# Patient Record
Sex: Male | Born: 1995
Health system: Southern US, Community
[De-identification: ages and names within clinical notes are randomized; demographics above are authoritative.]

---
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
## 2015-02-11 ENCOUNTER — Encounter (HOSPITAL_COMMUNITY): Payer: Self-pay | Admitting: Emergency Medicine

## 2015-02-11 ENCOUNTER — Emergency Department (HOSPITAL_COMMUNITY)
Admission: EM | Admit: 2015-02-11 | Discharge: 2015-02-11 | Disposition: A | Discharge status: Home / Self Care | Payer: Medicaid Other | Attending: Emergency Medicine | Admitting: Emergency Medicine

## 2015-02-11 DIAGNOSIS — Z792 Long term (current) use of antibiotics: Secondary | ICD-10-CM | POA: Diagnosis not present

## 2015-02-11 DIAGNOSIS — J069 Acute upper respiratory infection, unspecified: Secondary | ICD-10-CM | POA: Diagnosis not present

## 2015-02-11 DIAGNOSIS — Z7952 Long term (current) use of systemic steroids: Secondary | ICD-10-CM | POA: Diagnosis not present

## 2015-02-11 DIAGNOSIS — H9202 Otalgia, left ear: Secondary | ICD-10-CM | POA: Diagnosis present

## 2015-02-11 DIAGNOSIS — Z720 Tobacco use: Secondary | ICD-10-CM | POA: Insufficient documentation

## 2015-02-11 DIAGNOSIS — H6092 Unspecified otitis externa, left ear: Secondary | ICD-10-CM | POA: Diagnosis not present

## 2015-02-11 LAB — RAPID STREP SCREEN (MED CTR MEBANE ONLY): Streptococcus, Group A Screen (Direct): NEGATIVE

## 2015-02-11 MED ORDER — BENZONATATE 100 MG PO CAPS
200.0000 mg | ORAL_CAPSULE | Freq: Once | ORAL | Status: AC
  Administered 2015-02-11: 200 mg via ORAL
  Filled 2015-02-11: qty 2

## 2015-02-11 MED ORDER — NEOMYCIN-POLYMYXIN-HC 3.5-10000-1 OT SUSP: 4.0000 [drp] | Freq: Three times a day (TID) | OTIC | Status: AC

## 2015-02-11 MED ORDER — BENZONATATE 100 MG PO CAPS: 200.0000 mg | ORAL_CAPSULE | Freq: Three times a day (TID) | ORAL | Status: DC | PRN

## 2015-02-11 MED ORDER — IBUPROFEN 800 MG PO TABS
800.0000 mg | ORAL_TABLET | Freq: Once | ORAL | Status: AC
  Administered 2015-02-11: 800 mg via ORAL
  Filled 2015-02-11: qty 1

## 2015-02-11 MED ORDER — IBUPROFEN 600 MG PO TABS: 600.0000 mg | ORAL_TABLET | Freq: Four times a day (QID) | ORAL | Status: DC | PRN

## 2015-02-11 NOTE — ED Provider Notes (Signed)
CSN: [id number]     Arrival date & time 02/11/15  0815 History   First MD Initiated Contact with Patient 02/11/15 [phone]     Chief Complaint  Patient presents with  . Otalgia     (Consider location/radiation/quality/duration/timing/severity/associated sxs/prior Treatment) The history is provided by the patient.   Jeffery Brooks is a 19 y.o. male presenting with a 2 day history of uri type symptoms which started with sore throat, cough, nasal congestion described as thick and bright green without blood clots or streaks of blood, aching mid forehead pain and now woke today with pain in his left ear.  He denies drainage from the ear and has had no hearing loss or tinnitus.  He denies fevers, but does endorse post nasal drip.  He was exposed to a coworker early in the week with similar symptoms.  His cough has been productive of mucus similar to the post nasal drip.  He denies sob, chest pain,nausea or vomiting.  He endorses decreased appetite over the last day.  He has taken no medicines for his symptoms prior to arrival.   History reviewed. No pertinent past medical history. History reviewed. No pertinent past surgical history. History reviewed. No pertinent family history. History  Substance Use Topics  . Smoking status: Current Every Day Smoker -- 0.50 packs/day    Types: Cigarettes  . Smokeless tobacco: Never Used  . Alcohol Use: No    Review of Systems  Constitutional: Negative for fever and chills.  HENT: Positive for congestion, ear pain, postnasal drip, rhinorrhea and sore throat. Negative for facial swelling, sinus pressure, trouble swallowing and voice change.   Eyes: Negative for discharge.  Respiratory: Positive for cough. Negative for shortness of breath, wheezing and stridor.   Cardiovascular: Negative for chest pain.  Gastrointestinal: Negative for nausea, vomiting and abdominal pain.  Genitourinary: Negative.       Allergies  Review of patient's allergies  indicates no known allergies.  Home Medications   Prior to Admission medications   Medication Sig Start Date End Date Taking? Authorizing Provider  alum & mag hydroxide-simeth (MAALOX/MYLANTA) 200-200-20 MG/5ML suspension Take by mouth every 6 (six) hours as needed for indigestion or heartburn.    Historical Provider, MD  azithromycin (ZITHROMAX Z-PAK) 250 MG tablet Take 2 tablets (500 mg) on  Day 1,  followed by 1 tablet (250 mg) once daily on Days 2 through 5. Patient not taking: Reported on 10/12/2014 01/05/13   Babs Sciara, MD  benzonatate (TESSALON) 100 MG capsule Take 2 capsules (200 mg total) by mouth 3 (three) times daily as needed. 02/11/15   Burgess Amor, PA-C  hyoscyamine (LEVSIN/SL) 0.125 MG SL tablet Place 1 tablet (0.125 mg total) under the tongue every 6 (six) hours as needed for cramping. Patient not taking: Reported on 10/12/2014 03/15/13   Babs Sciara, MD  ibuprofen (ADVIL,MOTRIN) 600 MG tablet Take 1 tablet (600 mg total) by mouth every 6 (six) hours as needed. 02/11/15   Burgess Amor, PA-C  neomycin-polymyxin-hydrocortisone (CORTISPORIN) 3.5-10000-1 otic suspension Place 4 drops into the left ear 3 (three) times daily. 02/11/15 02/18/15  Burgess Amor, PA-C  ondansetron (ZOFRAN) 4 MG tablet Take 1 tablet (4 mg total) by mouth every 6 (six) hours. Patient not taking: Reported on 02/11/2015 10/12/14   Raeford Razor, MD  triamcinolone cream (KENALOG) 0.1 % Apply 1 application topically 2 (two) times daily. Prn rash; use up to 2 weeks Patient not taking: Reported on 10/12/2014 12/08/13   Presley Raddle  Hoskins, NP   BP 139/110 mmHg  Pulse 100  Temp(Src) 98.1 F (36.7 C) (Oral)  Resp 18  Ht 5\' 7"  (1.702 m)  Wt 185 lb (83.915 kg)  BMI 28.97 kg/m2  SpO2 99% Physical Exam  Constitutional: He is oriented to person, place, and time. He appears well-developed and well-nourished.  HENT:  Head: Normocephalic and atraumatic.  Right Ear: Tympanic membrane and ear canal normal.  Left Ear: Tympanic  membrane normal. No swelling. Tympanic membrane is not injected.  Nose: Mucosal edema present. No rhinorrhea. Right sinus exhibits no maxillary sinus tenderness and no frontal sinus tenderness. Left sinus exhibits no maxillary sinus tenderness and no frontal sinus tenderness.  Mouth/Throat: Uvula is midline and mucous membranes are normal. Posterior oropharyngeal erythema present. No oropharyngeal exudate, posterior oropharyngeal edema or tonsillar abscesses.  Erythema mid left ear canal, no appreciable edema, no drainage.  Eyes: Conjunctivae are normal.  Cardiovascular: Normal rate and normal heart sounds.   Pulmonary/Chest: Effort normal. No respiratory distress. He has no wheezes. He has no rales.  Abdominal: Soft. There is no tenderness.  Musculoskeletal: Normal range of motion.  Neurological: He is alert and oriented to person, place, and time.  Skin: Skin is warm and dry. No rash noted.  Psychiatric: He has a normal mood and affect.    ED Course  Procedures (including critical care time) Labs Review Labs Reviewed  RAPID STREP SCREEN (NOT AT [facility])  CULTURE, GROUP A STREP    Imaging Review No results found.   EKG Interpretation None      MDM   Final diagnoses:  Acute URI  Otitis externa, left    Patients labs and/or radiological studies were reviewed and considered during the medical decision making and disposition process.  Results were also discussed with patient. Strep negative.  Suspect viral uri, although left ear canal has appearance of possible early external otitis.  Prescribed ibuprofen, tessalon for cough, cortisporin otic for left external otitis.  Plan f/u with pcp if not improving.  Also advised recheck of bp once feeling better as it is elevated today.    Burgess Amor, PA-C 02/11/15 [id number]  Layla Maw Ward, DO 02/11/15 [id number]

## 2015-02-11 NOTE — ED Notes (Signed)
Patient c/o left earache, sore throat, and headache that woke him this morning. Denies any fevers or drainage from ear.

## 2015-02-11 NOTE — Discharge Instructions (Signed)
Otitis Externa Otitis externa is a bacterial or fungal infection of the outer ear canal. This is the area from the eardrum to the outside of the ear. Otitis externa is sometimes called "swimmer's ear." CAUSES  Possible causes of infection include:  Swimming in dirty water.  Moisture remaining in the ear after swimming or bathing.  Mild injury (trauma) to the ear.  Objects stuck in the ear (foreign body).  Cuts or scrapes (abrasions) on the outside of the ear. SIGNS AND SYMPTOMS  The first symptom of infection is often itching in the ear canal. Later signs and symptoms may include swelling and redness of the ear canal, ear pain, and yellowish-white fluid (pus) coming from the ear. The ear pain may be worse when pulling on the earlobe. DIAGNOSIS  Your health care provider will perform a physical exam. A sample of fluid may be taken from the ear and examined for bacteria or fungi. TREATMENT  Antibiotic ear drops are often given for 10 to 14 days. Treatment may also include pain medicine or corticosteroids to reduce itching and swelling. HOME CARE INSTRUCTIONS   Apply antibiotic ear drops to the ear canal as prescribed by your health care provider.  Take medicines only as directed by your health care provider.  If you have diabetes, follow any additional treatment instructions from your health care provider.  Keep all follow-up visits as directed by your health care provider. PREVENTION   Keep your ear dry. Use the corner of a towel to absorb water out of the ear canal after swimming or bathing.  Avoid scratching or putting objects inside your ear. This can damage the ear canal or remove the protective wax that lines the canal. This makes it easier for bacteria and fungi to grow.  Avoid swimming in lakes, polluted water, or poorly chlorinated pools.  You may use ear drops made of rubbing alcohol and vinegar after swimming. Combine equal parts of white vinegar and alcohol in a bottle.  Put 3 or 4 drops into each ear after swimming. SEEK MEDICAL CARE IF:   You have a fever.  Your ear is still red, swollen, painful, or draining pus after 3 days.  Your redness, swelling, or pain gets worse.  You have a severe headache.  You have redness, swelling, pain, or tenderness in the area behind your ear. MAKE SURE YOU:   Understand these instructions.  Will watch your condition.  Will get help right away if you are not doing well or get worse. Document Released: 08/04/2005 Document Revised: 12/19/2013 Document Reviewed: 08/21/2011 [facility] Patient Information 2015 [place], [place]. This information is not intended to replace advice given to you by your health care provider. Make sure you discuss any questions you have with your health care provider.  Upper Respiratory Infection, Adult An upper respiratory infection (URI) is also known as the common cold. It is often caused by a type of germ (virus). Colds are easily spread (contagious). You can pass it to others by kissing, coughing, sneezing, or drinking out of the same glass. Usually, you get better in 1 or 2 weeks.  HOME CARE   Only take medicine as told by your doctor.  Use a warm mist humidifier or breathe in steam from a hot shower.  Drink enough water and fluids to keep your pee (urine) clear or pale yellow.  Get plenty of rest.  Return to work when your temperature is back to normal or as told by your doctor. You may use a  face mask and wash your hands to stop your cold from spreading. GET HELP RIGHT AWAY IF:   After the first few days, you feel you are getting worse.  You have questions about your medicine.  You have chills, shortness of breath, or brown or red spit (mucus).  You have yellow or brown snot (nasal discharge) or pain in the face, especially when you bend forward.  You have a fever, puffy (swollen) neck, pain when you swallow, or white spots in the back of your throat.  You have a bad  headache, ear pain, sinus pain, or chest pain.  You have a high-pitched whistling sound when you breathe in and out (wheezing).  You have a lasting cough or cough up blood.  You have sore muscles or a stiff neck. MAKE SURE YOU:   Understand these instructions.  Will watch your condition.  Will get help right away if you are not doing well or get worse. Document Released: 01/21/2008 Document Revised: 10/27/2011 Document Reviewed: 11/09/2013 [facility] Patient Information 2015 [place], [place]. This information is not intended to replace advice given to you by your health care provider. Make sure you discuss any questions you have with your health care provider.

## 2015-02-13 LAB — CULTURE, GROUP A STREP

## 2015-05-28 ENCOUNTER — Encounter: Payer: Self-pay | Admitting: Family Medicine

## 2015-05-28 ENCOUNTER — Ambulatory Visit (INDEPENDENT_AMBULATORY_CARE_PROVIDER_SITE_OTHER): Payer: Medicaid Other | Admitting: Family Medicine

## 2015-05-28 VITALS — BP 118/82 | Ht 66.75 in | Wt 195.0 lb

## 2015-05-28 DIAGNOSIS — Z0000 Encounter for general adult medical examination without abnormal findings: Secondary | ICD-10-CM

## 2015-05-28 NOTE — Progress Notes (Signed)
   Subjective:    Patient ID: Jeffery Brooks, male    DOB: Jan 11, 1996, 19 y.o.   MRN: [id number]  HPI The patient comes in today for a wellness visit.    A review of their health history was completed.  A review of medications was also completed.  Any needed refills; not taking any meds.   Eating habits: not health conscious  Falls/  MVA accidents in past few months: no falls or accidents  Regular exercise: physical job. Some exercise  Specialist pt sees on regular basis: none  Preventative health issues were discussed.   Additional concerns: none     Review of Systems  Constitutional: Negative for fever, activity change and appetite change.  HENT: Negative for congestion and rhinorrhea.   Eyes: Negative for discharge.  Respiratory: Negative for cough and wheezing.   Cardiovascular: Negative for chest pain.  Gastrointestinal: Negative for vomiting, abdominal pain and blood in stool.  Genitourinary: Negative for frequency and difficulty urinating.  Musculoskeletal: Negative for neck pain.  Skin: Negative for rash.  Allergic/Immunologic: Negative for environmental allergies and food allergies.  Neurological: Negative for weakness and headaches.  Psychiatric/Behavioral: Negative for agitation.       Objective:   Physical Exam  Constitutional: He appears well-developed and well-nourished.  HENT:  Head: Normocephalic and atraumatic.  Right Ear: External ear normal.  Left Ear: External ear normal.  Nose: Nose normal.  Mouth/Throat: Oropharynx is clear and moist.  Eyes: EOM are normal. Pupils are equal, round, and reactive to light.  Neck: Normal range of motion. Neck supple. No thyromegaly present.  Cardiovascular: Normal rate, regular rhythm and normal heart sounds.   No murmur heard. Pulmonary/Chest: Effort normal and breath sounds normal. No respiratory distress. He has no wheezes.  Abdominal: Soft. Bowel sounds are normal. He exhibits no distension and no  mass. There is no tenderness.  Genitourinary: Penis normal.  Musculoskeletal: Normal range of motion. He exhibits no edema.  Lymphadenopathy:    He has no cervical adenopathy.  Neurological: He is alert. He exhibits normal muscle tone.  Skin: Skin is warm and dry. No erythema.  Psychiatric: He has a normal mood and affect. His behavior is normal. Judgment normal.          Assessment & Plan:  Wellness Safety dietary discussed Immunizations recommended he will go to the health department to get these Regular physical activity watching diet discussed Patient encouraged quit smoking.

## 2015-05-28 NOTE — Patient Instructions (Signed)
Smoking Cessation, Tips for Success If you are ready to quit smoking, congratulations! You have chosen to help yourself be healthier. Cigarettes bring nicotine, tar, carbon monoxide, and other irritants into your body. Your lungs, heart, and blood vessels will be able to work better without these poisons. There are many different ways to quit smoking. Nicotine gum, nicotine patches, a nicotine inhaler, or nicotine nasal spray can help with physical craving. Hypnosis, support groups, and medicines help break the habit of smoking. WHAT THINGS CAN I DO TO MAKE QUITTING EASIER?  Here are some tips to help you quit for good:  Pick a date when you will quit smoking completely. Tell all of your friends and family about your plan to quit on that date.  Do not try to slowly cut down on the number of cigarettes you are smoking. Pick a quit date and quit smoking completely starting on that day.  Throw away all cigarettes.   Clean and remove all ashtrays from your home, work, and car.  On a card, write down your reasons for quitting. Carry the card with you and read it when you get the urge to smoke.  Cleanse your body of nicotine. Drink enough water and fluids to keep your urine clear or pale yellow. Do this after quitting to flush the nicotine from your body.  Learn to predict your moods. Do not let a bad situation be your excuse to have a cigarette. Some situations in your life might tempt you into wanting a cigarette.  Never have "just one" cigarette. It leads to wanting another and another. Remind yourself of your decision to quit.  Change habits associated with smoking. If you smoked while driving or when feeling stressed, try other activities to replace smoking. Stand up when drinking your coffee. Brush your teeth after eating. Sit in a different chair when you read the paper. Avoid alcohol while trying to quit, and try to drink fewer caffeinated beverages. Alcohol and caffeine may urge you to  smoke.  Avoid foods and drinks that can trigger a desire to smoke, such as sugary or spicy foods and alcohol.  Ask people who smoke not to smoke around you.  Have something planned to do right after eating or having a cup of coffee. For example, plan to take a walk or exercise.  Try a relaxation exercise to calm you down and decrease your stress. Remember, you may be tense and nervous for the first 2 weeks after you quit, but this will pass.  Find new activities to keep your hands busy. Play with a pen, coin, or rubber band. Doodle or draw things on paper.  Brush your teeth right after eating. This will help cut down on the craving for the taste of tobacco after meals. You can also try mouthwash.   Use oral substitutes in place of cigarettes. Try using lemon drops, carrots, cinnamon sticks, or chewing gum. Keep them handy so they are available when you have the urge to smoke.  When you have the urge to smoke, try deep breathing.  Designate your home as a nonsmoking area.  If you are a heavy smoker, ask your health care Favor Hackler about a prescription for nicotine chewing gum. It can ease your withdrawal from nicotine.  Reward yourself. Set aside the cigarette money you save and buy yourself something nice.  Look for support from others. Join a support group or smoking cessation program. Ask someone at home or at work to help you with your plan   to quit smoking.  Always ask yourself, "Do I need this cigarette or is this just a reflex?" Tell yourself, "Today, I choose not to smoke," or "I do not want to smoke." You are reminding yourself of your decision to quit.  Do not replace cigarette smoking with electronic cigarettes (commonly called e-cigarettes). The safety of e-cigarettes is unknown, and some may contain harmful chemicals.  If you relapse, do not give up! Plan ahead and think about what you will do the next time you get the urge to smoke. HOW WILL I FEEL WHEN I QUIT SMOKING? You  may have symptoms of withdrawal because your body is used to nicotine (the addictive substance in cigarettes). You may crave cigarettes, be irritable, feel very hungry, cough often, get headaches, or have difficulty concentrating. The withdrawal symptoms are only temporary. They are strongest when you first quit but will go away within 10-14 days. When withdrawal symptoms occur, stay in control. Think about your reasons for quitting. Remind yourself that these are signs that your body is healing and getting used to being without cigarettes. Remember that withdrawal symptoms are easier to treat than the major diseases that smoking can cause.  Even after the withdrawal is over, expect periodic urges to smoke. However, these cravings are generally short lived and will go away whether you smoke or not. Do not smoke! WHAT RESOURCES ARE AVAILABLE TO HELP ME QUIT SMOKING? Your health care Lillyana Majette can direct you to community resources or hospitals for support, which may include:  Group support.  Education.  Hypnosis.  Therapy.   This information is not intended to replace advice given to you by your health care Jaheim Canino. Make sure you discuss any questions you have with your health care Ilithyia Titzer.   Document Released: 05/02/2004 Document Revised: 08/25/2014 Document Reviewed: 01/20/2013 Elsevier Interactive Patient Education 2016 Elsevier Inc.  

## 2015-08-27 ENCOUNTER — Emergency Department (HOSPITAL_COMMUNITY)
Admission: EM | Admit: 2015-08-27 | Discharge: 2015-08-27 | Disposition: A | Discharge status: Home / Self Care | Payer: Medicaid Other | Attending: Emergency Medicine | Admitting: Emergency Medicine

## 2015-08-27 ENCOUNTER — Encounter (HOSPITAL_COMMUNITY): Payer: Self-pay | Admitting: Emergency Medicine

## 2015-08-27 DIAGNOSIS — Y9289 Other specified places as the place of occurrence of the external cause: Secondary | ICD-10-CM | POA: Insufficient documentation

## 2015-08-27 DIAGNOSIS — W228XXA Striking against or struck by other objects, initial encounter: Secondary | ICD-10-CM | POA: Insufficient documentation

## 2015-08-27 DIAGNOSIS — S0993XA Unspecified injury of face, initial encounter: Secondary | ICD-10-CM | POA: Diagnosis present

## 2015-08-27 DIAGNOSIS — Y9323 Activity, snow (alpine) (downhill) skiing, snow boarding, sledding, tobogganing and snow tubing: Secondary | ICD-10-CM | POA: Insufficient documentation

## 2015-08-27 DIAGNOSIS — Z23 Encounter for immunization: Secondary | ICD-10-CM | POA: Diagnosis not present

## 2015-08-27 DIAGNOSIS — F17210 Nicotine dependence, cigarettes, uncomplicated: Secondary | ICD-10-CM | POA: Diagnosis not present

## 2015-08-27 DIAGNOSIS — S025XXA Fracture of tooth (traumatic), initial encounter for closed fracture: Secondary | ICD-10-CM | POA: Insufficient documentation

## 2015-08-27 DIAGNOSIS — S01511A Laceration without foreign body of lip, initial encounter: Secondary | ICD-10-CM | POA: Diagnosis not present

## 2015-08-27 DIAGNOSIS — Y998 Other external cause status: Secondary | ICD-10-CM | POA: Diagnosis not present

## 2015-08-27 DIAGNOSIS — S0181XA Laceration without foreign body of other part of head, initial encounter: Secondary | ICD-10-CM

## 2015-08-27 MED ORDER — LIDOCAINE-EPINEPHRINE-TETRACAINE (LET) SOLUTION
3.0000 mL | Freq: Once | NASAL | Status: AC
  Administered 2015-08-27: 3 mL via TOPICAL
  Filled 2015-08-27: qty 3

## 2015-08-27 MED ORDER — LIDOCAINE-EPINEPHRINE (PF) 2 %-1:200000 IJ SOLN
20.0000 mL | Freq: Once | INTRAMUSCULAR | Status: AC
  Administered 2015-08-27: 20 mL
  Filled 2015-08-27: qty 20

## 2015-08-27 MED ORDER — BACITRACIN ZINC 500 UNIT/GM EX OINT
TOPICAL_OINTMENT | CUTANEOUS | Status: AC
  Administered 2015-08-27: 1
  Filled 2015-08-27: qty 0.9

## 2015-08-27 MED ORDER — TETANUS-DIPHTH-ACELL PERTUSSIS 5-2.5-18.5 LF-MCG/0.5 IM SUSP
0.5000 mL | Freq: Once | INTRAMUSCULAR | Status: AC
  Administered 2015-08-27: 0.5 mL via INTRAMUSCULAR
  Filled 2015-08-27: qty 0.5

## 2015-08-27 MED ORDER — POVIDONE-IODINE 10 % EX SOLN
CUTANEOUS | Status: AC
  Filled 2015-08-27: qty 118

## 2015-08-27 MED ORDER — KETOROLAC TROMETHAMINE 60 MG/2ML IM SOLN
60.0000 mg | Freq: Once | INTRAMUSCULAR | Status: AC
  Administered 2015-08-27: 60 mg via INTRAMUSCULAR
  Filled 2015-08-27: qty 2

## 2015-08-27 NOTE — ED Notes (Signed)
Registration notified nurse that pt had near syncopal episode, did not pass out.  Pt wheeled to room EKG performed.

## 2015-08-27 NOTE — Discharge Instructions (Signed)
Please follow-up with your dentist regarding management of your dental injury.  Keep wound dry for first 24 hours. Apply bacitracin two times a day. See your primary care provider for wound check and suture removal in 5-7 days. YOu have 5 stitches that need removed. There are 2 dissolvable sutures on the inside and one dissolvable suture on the inside in the middle.  Watch for signs of infection and return without fail for fever, increased redness/swelling or pain, or any other symptoms concerning to you.  Facial Laceration A facial laceration is a cut on the face. These injuries can be painful and cause bleeding. Some cuts may need to be closed with stitches (sutures), skin adhesive strips, or wound glue. Cuts usually heal quickly but can leave a scar. It can take 1-2 years for the scar to go away completely. HOME CARE   Only take medicines as told by your doctor.  Follow your doctor's instructions for wound care. For Stitches:  Keep the cut clean and dry.  If you have a bandage (dressing), change it at least once a day. Change the bandage if it gets wet or dirty, or as told by your doctor.  Wash the cut with soap and water 2 times a day. Rinse the cut with water. Pat it dry with a clean towel.  Put a thin layer of medicated cream on the cut as told by your doctor.  You may shower after the first 24 hours. Do not soak the cut in water until the stitches are removed.  Have your stitches removed as told by your doctor.  Do not wear any makeup until a few days after your stitches are removed. For Skin Adhesive Strips:  Keep the cut clean and dry.  Do not get the strips wet. You may take a bath, but be careful to keep the cut dry.  If the cut gets wet, pat it dry with a clean towel.  The strips will fall off on their own. Do not remove the strips that are still stuck to the cut. For Wound Glue:  You may shower or take baths. Do not soak or scrub the cut. Do not swim. Avoid heavy  sweating until the glue falls off on its own. After a shower or bath, pat the cut dry with a clean towel.  Do not put medicine or makeup on your cut until the glue falls off.  If you have a bandage, do not put tape over the glue.  Avoid lots of sunlight or tanning lamps until the glue falls off.  The glue will fall off on its own in 5-10 days. Do not pick at the glue. After Healing:  Put sunscreen on the cut for the first year to reduce your scar. GET HELP IF:  You have a fever. GET HELP RIGHT AWAY IF:   Your cut area gets red, painful, or puffy (swollen).  You see a yellowish-white fluid (pus) coming from the cut.   This information is not intended to replace advice given to you by your health care provider. Make sure you discuss any questions you have with your health care provider.   Document Released: 01/21/2008 Document Revised: 08/25/2014 Document Reviewed: 03/17/2013 Elsevier Interactive Patient Education 2016 Elsevier Inc.  Laceration Care, Adult A laceration is a cut that goes through all layers of the skin. The cut also goes into the tissue that is right under the skin. Some cuts heal on their own. Others need to be closed with stitches (  sutures), staples, skin adhesive strips, or wound glue. Taking care of your cut lowers your risk of infection and helps your cut to heal better. HOW TO TAKE CARE OF YOUR CUT For stitches or staples:  Keep the wound clean and dry.  If you were given a bandage (dressing), you should change it at least one time per day or as told by your doctor. You should also change it if it gets wet or dirty.  Keep the wound completely dry for the first 24 hours or as told by your doctor. After that time, you may take a shower or a bath. However, make sure that the wound is not soaked in water until after the stitches or staples have been removed.  Clean the wound one time each day or as told by your doctor:  Wash the wound with soap and  water.  Rinse the wound with water until all of the soap comes off.  Pat the wound dry with a clean towel. Do not rub the wound.  After you clean the wound, put a thin layer of antibiotic ointment on it as told by your doctor. This ointment:  Helps to prevent infection.  Keeps the bandage from sticking to the wound.  Have your stitches or staples removed as told by your doctor. If your doctor used skin adhesive strips:   Keep the wound clean and dry.  If you were given a bandage, you should change it at least one time per day or as told by your doctor. You should also change it if it gets dirty or wet.  Do not get the skin adhesive strips wet. You can take a shower or a bath, but be careful to keep the wound dry.  If the wound gets wet, pat it dry with a clean towel. Do not rub the wound.  Skin adhesive strips fall off on their own. You can trim the strips as the wound heals. Do not remove any strips that are still stuck to the wound. They will fall off after a while. If your doctor used wound glue:  Try to keep your wound dry, but you may briefly wet it in the shower or bath. Do not soak the wound in water, such as by swimming.  After you take a shower or a bath, gently pat the wound dry with a clean towel. Do not rub the wound.  Do not do any activities that will make you really sweaty until the skin glue has fallen off on its own.  Do not apply liquid, cream, or ointment medicine to your wound while the skin glue is still on.  If you were given a bandage, you should change it at least one time per day or as told by your doctor. You should also change it if it gets dirty or wet.  If a bandage is placed over the wound, do not let the tape for the bandage touch the skin glue.  Do not pick at the glue. The skin glue usually stays on for 5-10 days. Then, it falls off of the skin. General Instructions  To help prevent scarring, make sure to cover your wound with sunscreen  whenever you are outside after stitches are removed, after adhesive strips are removed, or when wound glue stays in place and the wound is healed. Make sure to wear a sunscreen of at least 30 SPF.  Take over-the-counter and prescription medicines only as told by your doctor.  If you were given antibiotic  medicine or ointment, take or apply it as told by your doctor. Do not stop using the antibiotic even if your wound is getting better.  Do not scratch or pick at the wound.  Keep all follow-up visits as told by your doctor. This is important.  Check your wound every day for signs of infection. Watch for:  Redness, swelling, or pain.  Fluid, blood, or pus.  Raise (elevate) the injured area above the level of your heart while you are sitting or lying down, if possible. GET HELP IF:  You got a tetanus shot and you have any of these problems at the injection site:  Swelling.  Very bad pain.  Redness.  Bleeding.  You have a fever.  A wound that was closed breaks open.  You notice a bad smell coming from your wound or your bandage.  You notice something coming out of the wound, such as wood or glass.  Medicine does not help your pain.  You have more redness, swelling, or pain at the site of your wound.  You have fluid, blood, or pus coming from your wound.  You notice a change in the color of your skin near your wound.  You need to change the bandage often because fluid, blood, or pus is coming from the wound.  You start to have a new rash.  You start to have numbness around the wound. GET HELP RIGHT AWAY IF:  You have very bad swelling around the wound.  Your pain suddenly gets worse and is very bad.  You notice painful lumps near the wound or on skin that is anywhere on your body.  You have a red streak going away from your wound.  The wound is on your hand or foot and you cannot move a finger or toe like you usually can.  The wound is on your hand or foot and  you notice that your fingers or toes look pale or bluish.   This information is not intended to replace advice given to you by your health care provider. Make sure you discuss any questions you have with your health care provider.   Document Released: 01/21/2008 Document Revised: 12/19/2014 Document Reviewed: 07/31/2014 Elsevier Interactive Patient Education [employer] Elsevier Inc.

## 2015-08-27 NOTE — ED Provider Notes (Signed)
CSN: [id number]     Arrival date & time 08/27/15  1657 History   First MD Initiated Contact with Patient 08/27/15 1720     Chief Complaint  Patient presents with  . Facial Injury     (Consider location/radiation/quality/duration/timing/severity/associated sxs/prior Treatment) HPI 20 year old male who presents with facial injury.  He reports going sledding today, and slight face first into a rock. He did not have any loss of consciousness, but sustained injury to the top of his left lip. Came to the ED for further evaluation. Initially ambulatory on scene, and denies any other injuries. History reviewed. No pertinent past medical history. History reviewed. No pertinent past surgical history. History reviewed. No pertinent family history. Social History  Substance Use Topics  . Smoking status: Current Every Day Smoker -- 0.50 packs/day    Types: Cigarettes  . Smokeless tobacco: Never Used  . Alcohol Use: No    Review of Systems  10/14 systems reviewed and are negative other than those stated in the HPI   Allergies  Review of patient's allergies indicates no known allergies.  Home Medications   Prior to Admission medications   Not on File   BP 108/51 mmHg  Pulse 84  Temp(Src) 97.9 F (36.6 C) (Oral)  Resp 16  Ht 5\' 7"  (1.702 m)  Wt 181 lb (82.101 kg)  BMI 28.34 kg/m2  SpO2 97% Physical Exam Physical Exam  Nursing note and vitals reviewed. Constitutional: Well developed, well nourished, non-toxic, and in no acute distress Head: Normocephalic. There is 1 cm laceration full thickness extending - horizontal above the upper lip and without involvement of the vermillion border. Mouth/Throat: Oropharynx is clear and moist. Chipped tooth involving teeth 21.  Neck: Normal range of motion. Neck supple.  Cardiovascular: Normal rate and regular rhythm.   Pulmonary/Chest: Effort normal and breath sounds normal.  Abdominal: Soft. There is no tenderness. There is no rebound and no  guarding.  Musculoskeletal: Normal range of motion.  Neurological: Alert, no facial droop, fluent speech, moves all extremities symmetrically Skin: Skin is warm and dry.  Psychiatric: Cooperative  ED Course  .Marland Kitchen.Laceration Repair Date/Time: 08/27/2015 8:54 PM Performed by: Crista CurbLIU, Kellyn Mccary DUO Authorized by: Crista CurbLIU, Naryiah Schley DUO Consent: Verbal consent obtained. Risks and benefits: risks, benefits and alternatives were discussed Consent given by: patient Patient identity confirmed: verbally with patient Time out: Immediately prior to procedure a "time out" was called to verify the correct patient, procedure, equipment, support staff and site/side marked as required. Body area: head/neck Location details: upper lip Full thickness lip laceration: yes Vermillion border involved: no Laceration length: 1 cm Foreign bodies: no foreign bodies Tendon involvement: none Nerve involvement: none Vascular damage: no Anesthesia: local infiltration Local anesthetic: lidocaine 2% without epinephrine Anesthetic total: 5 ml Patient sedated: no Preparation: Patient was prepped and draped in the usual sterile fashion. Irrigation solution: saline Irrigation method: syringe Amount of cleaning: standard Debridement: none Degree of undermining: none Skin closure: 6-0 nylon Mucous membrane closure: 6-0 fast-absorbing plain gut Subcutaneous closure: 5-0 Vicryl Number of sutures: 8 Technique: complex Approximation: close Approximation difficulty: complex Dressing: antibiotic ointment   (including critical care time) Labs Review Labs Reviewed - No data to display  Imaging Review No results found. I have personally reviewed and evaluated these images and lab results as part of my medical decision-making.   EKG Interpretation   Date/Time:  Monday August 27 2015 17:18:57 EST Ventricular Rate:  76 PR Interval:  111 QRS Duration: 97 QT Interval:  366 QTC Calculation: 411 R Axis:   73 Text Interpretation:   Sinus arrhythmia Borderline short PR interval No  prior EKG for comparison.  Confirmed by Elowyn Raupp MD, Annabelle Harman [phone]) on 08/27/2015  6:42:05 PM      MDM   Final diagnoses:  Facial laceration, initial encounter  Chipped tooth, closed, initial encounter     20 year old male who presents with facial laceration. He is well-appearing and in no acute distress on presentation. Vital signs are non-concerning. He has 1 cm  Full-thickness laceration  Above the upper lip , not involving the vermilion border. He also has a chipped tooth #21.  Reviewed of the teeth still remains intact , and there is no significant mobility involving the tooth. He will follow-up with his dentist regarding this injury. Laceration is repaired according to procedure note above. He will follow up closely with his primary care provider for wound reevaluation and eventual suture removal. Warning signs of infection discussed. Strict return and follow-up instructions reviewed. He expressed understanding of all discharge instructions and felt comfortable with the plan of care.     Lavera Guise, MD 08/27/15 2059

## 2015-08-27 NOTE — ED Notes (Addendum)
Pt reports sledding today, landing facefirst into rock, abrasion to upper lip and chin, front teeth chipped according to girlfriend.  Pt denies LOC.

## 2015-09-03 ENCOUNTER — Ambulatory Visit (INDEPENDENT_AMBULATORY_CARE_PROVIDER_SITE_OTHER): Payer: Medicaid Other | Admitting: Family Medicine

## 2015-09-03 ENCOUNTER — Encounter: Payer: Self-pay | Admitting: Family Medicine

## 2015-09-03 VITALS — BP 120/80 | Ht 66.75 in | Wt 195.2 lb

## 2015-09-03 DIAGNOSIS — Z4802 Encounter for removal of sutures: Secondary | ICD-10-CM

## 2015-09-03 NOTE — Progress Notes (Signed)
   Subjective:    Patient ID: Jeffery MantisChristopher L Brooks, male    DOB: 06/26/1996, 20 y.o.   MRN: [id number]  Suture / Staple Removal The sutures were placed 7 to 10 days ago. He tried nothing since the wound repair. The treatment provided significant relief. The pain has no pain.    Patient states no other concerns this visit.  Review of Systems     Objective:   Physical Exam  Patient had 4 sutures on his upper lip 3 underneath his lip the ones on the upper lip have crusting around it that required removal of the crusting causing significant discomfort a little bit of bleeding but patient survived      Assessment & Plan:  Sutures removed with minimal difficulty. Should gradually heal up well. No need for any type of follow-up unless problems warnings discussed

## 2017-04-23 ENCOUNTER — Ambulatory Visit: Payer: Self-pay | Admitting: Family Medicine

## 2017-05-21 ENCOUNTER — Ambulatory Visit (INDEPENDENT_AMBULATORY_CARE_PROVIDER_SITE_OTHER): Payer: Self-pay

## 2017-05-21 DIAGNOSIS — Z23 Encounter for immunization: Secondary | ICD-10-CM

## 2018-07-21 ENCOUNTER — Ambulatory Visit (INDEPENDENT_AMBULATORY_CARE_PROVIDER_SITE_OTHER): Payer: BLUE CROSS/BLUE SHIELD

## 2018-07-21 DIAGNOSIS — Z23 Encounter for immunization: Secondary | ICD-10-CM

## 2019-02-08 ENCOUNTER — Emergency Department (HOSPITAL_COMMUNITY)
Admission: EM | Admit: 2019-02-08 | Discharge: 2019-02-08 | Disposition: A | Discharge status: Home / Self Care | Payer: BC Managed Care – PPO | Attending: Emergency Medicine | Admitting: Emergency Medicine

## 2019-02-08 ENCOUNTER — Other Ambulatory Visit: Payer: Self-pay

## 2019-02-08 ENCOUNTER — Emergency Department (HOSPITAL_COMMUNITY): Payer: BC Managed Care – PPO

## 2019-02-08 DIAGNOSIS — S99922A Unspecified injury of left foot, initial encounter: Secondary | ICD-10-CM | POA: Diagnosis present

## 2019-02-08 DIAGNOSIS — F17210 Nicotine dependence, cigarettes, uncomplicated: Secondary | ICD-10-CM | POA: Diagnosis not present

## 2019-02-08 DIAGNOSIS — Y999 Unspecified external cause status: Secondary | ICD-10-CM | POA: Insufficient documentation

## 2019-02-08 DIAGNOSIS — Y9302 Activity, running: Secondary | ICD-10-CM | POA: Insufficient documentation

## 2019-02-08 DIAGNOSIS — S92422B Displaced fracture of distal phalanx of left great toe, initial encounter for open fracture: Secondary | ICD-10-CM

## 2019-02-08 DIAGNOSIS — Z23 Encounter for immunization: Secondary | ICD-10-CM | POA: Diagnosis not present

## 2019-02-08 DIAGNOSIS — W2209XA Striking against other stationary object, initial encounter: Secondary | ICD-10-CM | POA: Diagnosis not present

## 2019-02-08 DIAGNOSIS — Y92009 Unspecified place in unspecified non-institutional (private) residence as the place of occurrence of the external cause: Secondary | ICD-10-CM | POA: Diagnosis not present

## 2019-02-08 MED ORDER — TETANUS-DIPHTH-ACELL PERTUSSIS 5-2.5-18.5 LF-MCG/0.5 IM SUSP
0.5000 mL | Freq: Once | INTRAMUSCULAR | Status: AC
  Administered 2019-02-08: 0.5 mL via INTRAMUSCULAR
  Filled 2019-02-08: qty 0.5

## 2019-02-08 MED ORDER — HYDROCODONE-ACETAMINOPHEN 5-325 MG PO TABS
2.0000 | ORAL_TABLET | Freq: Once | ORAL | Status: AC
  Administered 2019-02-08: 2 via ORAL
  Filled 2019-02-08: qty 2

## 2019-02-08 MED ORDER — POVIDONE-IODINE 10 % EX SOLN
CUTANEOUS | Status: AC
  Filled 2019-02-08: qty 30

## 2019-02-08 MED ORDER — IBUPROFEN 600 MG PO TABS: 600.0000 mg | ORAL_TABLET | Freq: Four times a day (QID) | ORAL | 0 refills | Status: DC | PRN

## 2019-02-08 MED ORDER — CEPHALEXIN 500 MG PO CAPS: 500.0000 mg | ORAL_CAPSULE | Freq: Four times a day (QID) | ORAL | 0 refills | Status: DC

## 2019-02-08 MED ORDER — BUPIVACAINE HCL (PF) 0.5 % IJ SOLN
10.0000 mL | Freq: Once | INTRAMUSCULAR | Status: AC
  Administered 2019-02-08: 10 mL
  Filled 2019-02-08: qty 30

## 2019-02-08 MED ORDER — HYDROCODONE-ACETAMINOPHEN 5-325 MG PO TABS: 1.0000 | ORAL_TABLET | Freq: Four times a day (QID) | ORAL | 0 refills | Status: DC | PRN

## 2019-02-08 MED ORDER — ONDANSETRON 8 MG PO TBDP
8.0000 mg | ORAL_TABLET | Freq: Once | ORAL | Status: AC
  Administered 2019-02-08: 8 mg via ORAL
  Filled 2019-02-08: qty 1

## 2019-02-08 MED ORDER — IBUPROFEN 400 MG PO TABS
600.0000 mg | ORAL_TABLET | Freq: Once | ORAL | Status: AC
  Administered 2019-02-08: 600 mg via ORAL
  Filled 2019-02-08: qty 2

## 2019-02-08 NOTE — ED Notes (Signed)
Laceration tray and numbing medication set up at bedside.

## 2019-02-08 NOTE — Discharge Instructions (Addendum)
Wound Care - Laceration You may remove the bandage after 24 hours. Clean the wound and surrounding area gently with tap water and mild soap. Rinse well and blot dry. Do not scrub the wound, as this may cause the wound edges to come apart. You may shower, but avoid submerging the wound, such as with a bath or swimming. Clean the wound daily to prevent infection. Do not use cleaners such as hydrogen peroxide or alcohol.   Please take all of your antibiotics until finished!   You may develop abdominal discomfort or diarrhea from the antibiotic.  You may help offset this with probiotics which you can buy or get in yogurt. Do not eat or take the probiotics until 2 hours after your antibiotic.   Scar reduction: Application of a topical antibiotic ointment, such as Neosporin, after the wound has begun to close and heal well can decrease scab formation and reduce scarring. After the wound has healed and wound closures have been removed, application of ointments such as Aquaphor can also reduce scar formation.  The key to scar reduction is keeping the skin well hydrated and supple. Drinking plenty of water throughout the day (At least eight 8oz glasses of water a day) is essential to staying well hydrated.  Sun exposure: Keep the wound out of the sun. After the wound has healed, continue to protect it from the sun by wearing protective clothing or applying sunscreen.  Pain: You may use Tylenol, naproxen, or ibuprofen for pain. Antiinflammatory medications: Take 600 mg of ibuprofen every 6 hours or 440 mg (over the counter dose) to 500 mg (prescription dose) of naproxen every 12 hours for the next 3 days. After this time, these medications may be used as needed for pain. Take these medications with food to avoid upset stomach. Choose only one of these medications, do not take them together. Acetaminophen (generic for Tylenol): Should you continue to have additional pain while taking the ibuprofen or naproxen,  you may add in acetaminophen as needed. Your daily total maximum amount of acetaminophen from all sources should be limited to 4000mg /day for persons without liver problems, or 2000mg /day for those with liver problems.  The sutures should dissolve on their own.  Return to the ED sooner should the wound edges come apart or signs of infection arise, such as spreading redness, puffiness/swelling, pus draining from the wound, severe increase in pain, fever over 100.99F, or any other major issues.  For prescription assistance, may try using prescription discount sites or apps, such as goodrx.com  You have been seen today for a left toe injury.  It was evidence of a fracture with overlying wound. Antiinflammatory medications: Take 600 mg of ibuprofen every 6 hours or 440 mg (over the counter dose) to 500 mg (prescription dose) of naproxen every 12 hours for the next 3 days. After this time, these medications may be used as needed for pain. Take these medications with food to avoid upset stomach. Choose only one of these medications, do not take them together. Acetaminophen (generic for Tylenol): Should you continue to have additional pain while taking the ibuprofen or naproxen, you may add in acetaminophen as needed. Your daily total maximum amount of acetaminophen from all sources should be limited to 4000mg /day for persons without liver problems, or 2000mg /day for those with liver problems. Ice: May apply ice to the area over the next 24 hours for 15 minutes at a time to reduce swelling. Elevation: Keep the extremity elevated as often as possible  to reduce pain and inflammation. Support: Wear the postop shoe for support and comfort. Wear this until pain resolves.  Use the crutches for ambulation while the fracture heals. Follow up: Follow-up with the orthopedic specialist for continued management of this issue.  Follow-up is usually within the next week. Return: Return to the ED for numbness, weakness,  increasing pain, overall worsening symptoms, loss of function, or if symptoms are not improving, you have tried to follow up with the orthopedic specialist, and have been unable to do so.  For prescription assistance, may try using prescription discount sites or apps, such as goodrx.com

## 2019-02-08 NOTE — ED Provider Notes (Signed)
[facility] EMERGENCY DEPARTMENT Provider Note   CSN: [id number] Arrival date & time: 02/08/19  1210    History   Chief Complaint Chief Complaint  Patient presents with  . Foot Pain    HPI Jeffery Brooks is a 23 y.o. male.     HPI   Jeffery Brooks is a 23 y.o. male, patient with no pertinent past medical history, presenting to the ED with left big toe injury that occurred this morning a couple hours before arrival.  States he was running through his house and caught his left big toe on a door frame.  Pain is severe, throbbing, radiating throughout the toe.  Accompanied by a wound of some sort and bleeding.  Unknown tetanus status.  Denies numbness, other foot pain, other injuries, or any other complaints.   No past medical history on file.  There are no active problems to display for this patient.   No past surgical history on file.      Home Medications    Prior to Admission medications   Medication Sig Start Date End Date Taking? Authorizing Provider  cephALEXin (KEFLEX) 500 MG capsule Take 1 capsule (500 mg total) by mouth 4 (four) times daily. 02/08/19   Naomia Lenderman C, PA-C  HYDROcodone-acetaminophen (NORCO/VICODIN) 5-325 MG tablet Take 1-2 tablets by mouth every 6 (six) hours as needed for severe pain. 02/08/19   Kyndall Amero C, PA-C  ibuprofen (ADVIL) 600 MG tablet Take 1 tablet (600 mg total) by mouth every 6 (six) hours as needed. 02/08/19   Tamarius Rosenfield, Hillard DankerShawn C, PA-C    Family History No family history on file.  Social History Social History   Tobacco Use  . Smoking status: Current Every Day Smoker    Packs/day: 0.50    Types: Cigarettes  . Smokeless tobacco: Never Used  Substance Use Topics  . Alcohol use: No  . Drug use: Yes    Types: Marijuana     Allergies   Strawberry (diagnostic)   Review of Systems Review of Systems  Musculoskeletal: Positive for arthralgias and joint swelling.  Skin: Positive for wound.  Neurological: Negative  for numbness.     Physical Exam Updated Vital Signs BP 139/64 (BP Location: Right Arm)   Pulse 89   Temp 98.2 F (36.8 C)   Resp 15   Ht 5\' 10"  (1.778 m)   Wt 87.1 kg   SpO2 98%   BMI 27.55 kg/m   Physical Exam Vitals signs and nursing note reviewed.  Constitutional:      General: He is not in acute distress.    Appearance: He is well-developed. He is not diaphoretic.  HENT:     Head: Normocephalic and atraumatic.  Eyes:     Conjunctiva/sclera: Conjunctivae normal.  Neck:     Musculoskeletal: Neck supple.  Cardiovascular:     Rate and Rhythm: Normal rate and regular rhythm.     Pulses:          Dorsalis pedis pulses are 2+ on the left side.       Posterior tibial pulses are 2+ on the left side.  Pulmonary:     Effort: Pulmonary effort is normal.  Musculoskeletal:        General: Swelling and tenderness present.     Comments: Tenderness, swelling, and bruising to left great toe especially towards the distal end. The patient's nail has been partially unroofed from the nail bed. Complete detachment at the proximal end.  There is hemorrhage that  appears to originally from underneath the toenail. There appears to be full range of motion at the MTP joint.  The rest of the foot and toes were palpated without tenderness, swelling, bruising, wounds, or other evidence of injury. Full range of motion at the ankle without pain or noted difficulty.  Skin:    General: Skin is warm and dry.     Capillary Refill: Capillary refill takes less than 2 seconds.     Coloration: Skin is not pale.  Neurological:     Mental Status: He is alert.     Comments: Sensation to light touch grossly intact in the left great toe, as well as the rest of the foot and other toes. Flexion and extension of the left great toe intact against resistance.  Psychiatric:        Behavior: Behavior normal.          ED Treatments / Results  Labs (all labs ordered are listed, but only abnormal results are  displayed) Labs Reviewed - No data to display  EKG None  Radiology Dg Toe Great Left  Result Date: 02/08/2019 CLINICAL DATA:  The patient struck his left great toe on door frame today. Pain. Initial encounter. EXAM: LEFT GREAT TOE COMPARISON:  None. FINDINGS: Slightly displaced fracture of the central and lateral tuft of the distal phalanx is seen. No other bony or joint abnormality. IMPRESSION: Tuft fracture of the great toe as described above. Electronically Signed   By: Drusilla Kannerhomas  Dalessio M.D.   On: 02/08/2019 13:10    Procedures .Nerve Block  Date/Time: 02/08/2019 2:48 PM Performed by: Anselm PancoastJoy, Mry Lamia C, PA-C Authorized by: Anselm PancoastJoy, Daianna Vasques C, PA-C   .Nail Removal  Date/Time: 02/08/2019 3:45 PM Performed by: Anselm PancoastJoy, Maicie Vanderloop C, PA-C Authorized by: Anselm PancoastJoy, Alliyah Roesler C, PA-C   Consent:    Consent obtained:  Verbal   Consent given by:  Patient   Risks discussed:  Bleeding, incomplete removal, infection, pain and permanent nail deformity Location:    Foot:  L big toe Pre-procedure details:    Skin preparation:  Betadine Anesthesia (see MAR for exact dosages):    Anesthesia method:  Nerve block   Block location:  Digital   Block needle gauge:  25 G   Block anesthetic:  Bupivacaine 0.5% w/o epi   Block injection procedure:  Anatomic landmarks identified, anatomic landmarks palpated, introduced needle, negative aspiration for blood and incremental injection   Block outcome:  Incomplete block (Block had to be partially repeated) Nail Removal:    Nail removed:  Complete   Nail bed repaired: yes     Nail bed repair material:  5-0 chromic gut   Number of sutures:  4 Post-procedure details:    Dressing:  4x4 sterile gauze and post-op shoe   Patient tolerance of procedure:  Tolerated well, no immediate complications .Marland Kitchen.Laceration Repair  Date/Time: 02/08/2019 5:15 PM Performed by: Anselm PancoastJoy, Yiannis Tulloch C, PA-C Authorized by: Anselm PancoastJoy, Shalaina Guardiola C, PA-C   Consent:    Consent obtained:  Verbal   Consent given by:   Patient   Risks discussed:  Infection, need for additional repair, pain, poor cosmetic result and poor wound healing Anesthesia (see MAR for exact dosages):    Anesthesia method:  Nerve block   Block location:  Digital   Block needle gauge:  25 G   Block anesthetic:  Bupivacaine 0.5% w/o epi   Block injection procedure:  Anatomic landmarks identified, anatomic landmarks palpated, introduced needle, negative aspiration for blood and incremental injection  Block outcome:  Incomplete block (Block had to be partially repeated) Laceration details:    Location:  Toe   Toe location:  L big toe   Length (cm):  2 Repair type:    Repair type:  Simple Pre-procedure details:    Preparation:  Patient was prepped and draped in usual sterile fashion and imaging obtained to evaluate for foreign bodies Exploration:    Wound exploration: wound explored through full range of motion   Treatment:    Area cleansed with:  Betadine and saline   Amount of cleaning:  Extensive   Irrigation solution:  Sterile saline   Irrigation method:  Syringe Skin repair:    Repair method:  Sutures   Suture size:  5-0   Suture material:  Chromic gut   Suture technique:  Simple interrupted   Number of sutures:  4 Approximation:    Approximation:  Loose Post-procedure details:    Dressing:  Non-adherent dressing and sterile dressing   Patient tolerance of procedure:  Tolerated well, no immediate complications   (including critical care time)  Medications Ordered in ED Medications  povidone-iodine (BETADINE) 10 % external solution (has no administration in time range)  bupivacaine (MARCAINE) 0.5 % injection 10 mL (10 mLs Infiltration Given by Other 02/08/19 1656)  Tdap (BOOSTRIX) injection 0.5 mL (0.5 mLs Intramuscular Given 02/08/19 1406)  HYDROcodone-acetaminophen (NORCO/VICODIN) 5-325 MG per tablet 2 tablet (2 tablets Oral Given 02/08/19 1405)  ibuprofen (ADVIL) tablet 600 mg (600 mg Oral Given 02/08/19 1405)   ondansetron (ZOFRAN-ODT) disintegrating tablet 8 mg (8 mg Oral Given 02/08/19 1656)     Initial Impression / Assessment and Plan / ED Course  I have reviewed the triage vital signs and the nursing notes.  Pertinent labs & imaging results that were available during my care of the patient were reviewed by me and considered in my medical decision making (see chart for details).  Clinical Course as of Feb 07 1758  Tue Feb 08, 2019  1610 Patient began to feel pain to the lateral aspect of the nailbed. He also began to feel hot and nauseous.  We discussed options.  He stated he would like to take a break.  He also opted for repeat injection of that side.   [SJ]  1616 Patient had to be reinjected for partial digital block on the lateral side of the left big toe.   [SJ]  1710 Spoek with Dr. Everardo PacificVarkey, orthopedics. Agrees with plan of suturing nail bed, place patient on ABX, splint with postop shoe, and office follow up.   [SJ]    Clinical Course User Index [SJ] Shaquil Aldana C, PA-C       Patient presents with a left great toe injury.  Nail disruption present with evidence of hemorrhage from underneath the toenail required toenail removal.  After toenail removal, a laceration to the nailbed was evident.  This was repaired and due to possible connection with the patient's fracture, patient was placed on antibiotic therapy.  Support for the fracture was provided with a postop shoe and crutches. Orthopedic follow-up. The patient was given instructions for home care as well as return precautions. Patient voices understanding of these instructions, accepts the plan, and is comfortable with discharge.   Delays in each stage were due to caring for other more critical patients and other happenings in the ED at the time.    Final Clinical Impressions(s) / ED Diagnoses   Final diagnoses:  Displaced fracture of distal phalanx of left  great toe, initial encounter for open fracture    ED Discharge Orders          Ordered    HYDROcodone-acetaminophen (NORCO/VICODIN) 5-325 MG tablet  Every 6 hours PRN     02/08/19 1742    ibuprofen (ADVIL) 600 MG tablet  Every 6 hours PRN     02/08/19 1742    cephALEXin (KEFLEX) 500 MG capsule  4 times daily     02/08/19 1742           Lorayne Bender, PA-C 02/08/19 1803    Fredia Sorrow, MD 02/13/19 1644

## 2019-02-08 NOTE — ED Triage Notes (Signed)
Pt thinks he broke his left big toe. Pt was running and stomped his toe yesterday. Went to work today and noticed toe was bleeding. Able to walk on it with pain

## 2019-05-02 ENCOUNTER — Ambulatory Visit (INDEPENDENT_AMBULATORY_CARE_PROVIDER_SITE_OTHER): Payer: BC Managed Care – PPO | Admitting: Family Medicine

## 2019-05-02 ENCOUNTER — Encounter: Payer: Self-pay | Admitting: Family Medicine

## 2019-05-02 ENCOUNTER — Other Ambulatory Visit: Payer: Self-pay | Admitting: Family Medicine

## 2019-05-02 DIAGNOSIS — R6889 Other general symptoms and signs: Secondary | ICD-10-CM | POA: Diagnosis not present

## 2019-05-02 DIAGNOSIS — Z20822 Contact with and (suspected) exposure to covid-19: Secondary | ICD-10-CM

## 2019-05-02 MED ORDER — AZITHROMYCIN 250 MG PO TABS: ORAL_TABLET | ORAL | 0 refills | Status: DC

## 2019-05-02 NOTE — Addendum Note (Signed)
Addended by: Vicente Males on: 05/02/2019 09:47 AM   Modules accepted: Orders

## 2019-05-02 NOTE — Progress Notes (Signed)
   Subjective:  atient ID: Jeffery Brooks, male    DOB: 1995-12-20, 23 y.o.   MRN: [id number] Audio only Cough This is a new problem. The current episode started yesterday. The cough is productive of sputum. Treatments tried: Nyquil to help sleep. The treatment provided mild relief.   Virtual Visit via Video Note  I connected with Jeffery Brooks on 05/02/19 at  9:30 AM EDT by a video enabled telemedicine application and verified that I am speaking with the correct person using two identifiers.  Location: Patient: home Provider: office   I discussed the limitations of evaluation and management by telemedicine and the availability of in person appointments. The patient expressed understanding and agreed to proceed.  History of Present Illness:    Observations/Objective:   Assessment and Plan:   Follow Up Instructions:    I discussed the assessment and treatment plan with the patient. The patient was provided an opportunity to ask questions and all were answered. The patient agreed with the plan and demonstrated an understanding of the instructions.   The patient was advised to call back or seek an in-person evaluation if the symptoms worsen or if the condition fails to improve as anticipated.  I provided20 minutes of non-face-to-face time during this encounter.   Vicente Males, LPN  Bad coughing  Cough productive   Some sore throat  No fever  Work Acupuncturist   No fever or chills    No sig sob  Review of Systems  Respiratory: Positive for cough.        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  Therefore some risk factor for COVID-19.  Discussed.  Cough is productive.  Will cover with an antibiotic.  Symptom care and warning signs discussed.  Recommend COVID-19 testing will help arrange

## 2019-06-10 ENCOUNTER — Other Ambulatory Visit: Payer: Self-pay

## 2019-06-11 ENCOUNTER — Other Ambulatory Visit (INDEPENDENT_AMBULATORY_CARE_PROVIDER_SITE_OTHER): Payer: BC Managed Care – PPO | Admitting: *Deleted

## 2019-06-11 DIAGNOSIS — Z23 Encounter for immunization: Secondary | ICD-10-CM

## 2019-12-08 IMAGING — DX LEFT GREAT TOE
4 series · 4 of 4 positions shown · non-contrast
Comparison: None.

CLINICAL DATA: The patient struck his left great toe on door frame
today. Pain. Initial encounter.

EXAM:
LEFT GREAT TOE

[toe ap]
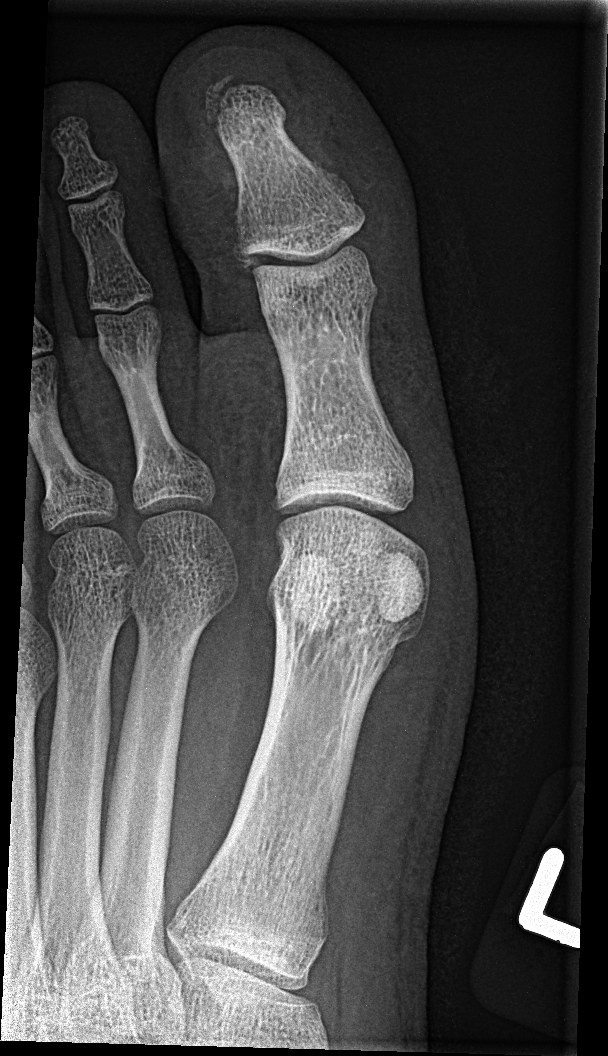

[toe obl]
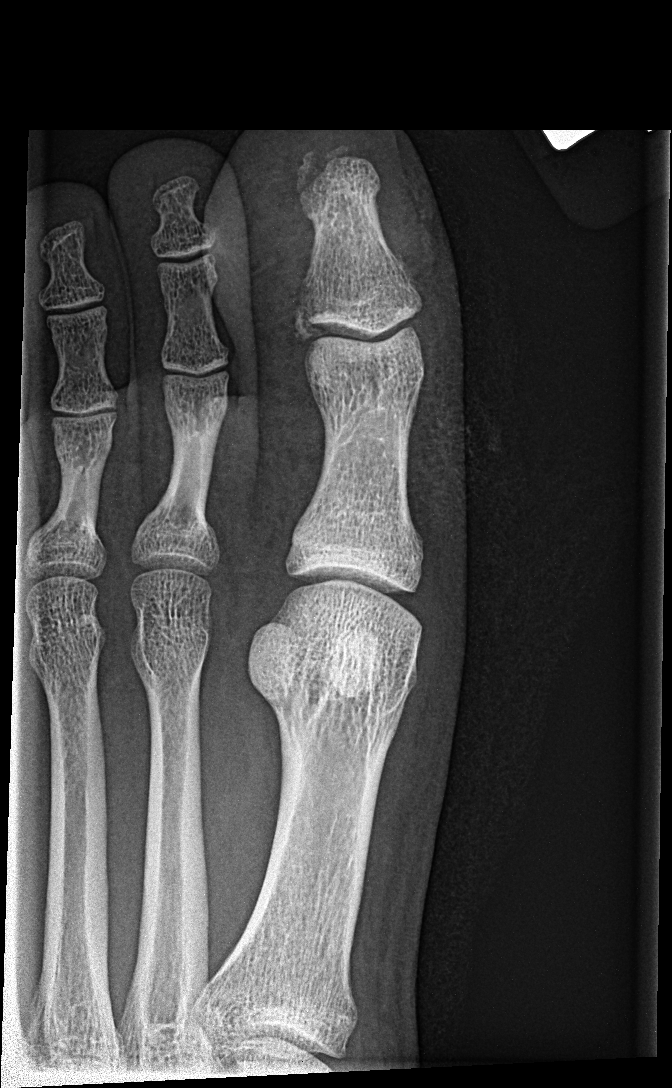

[toe lat (1 of 2)]
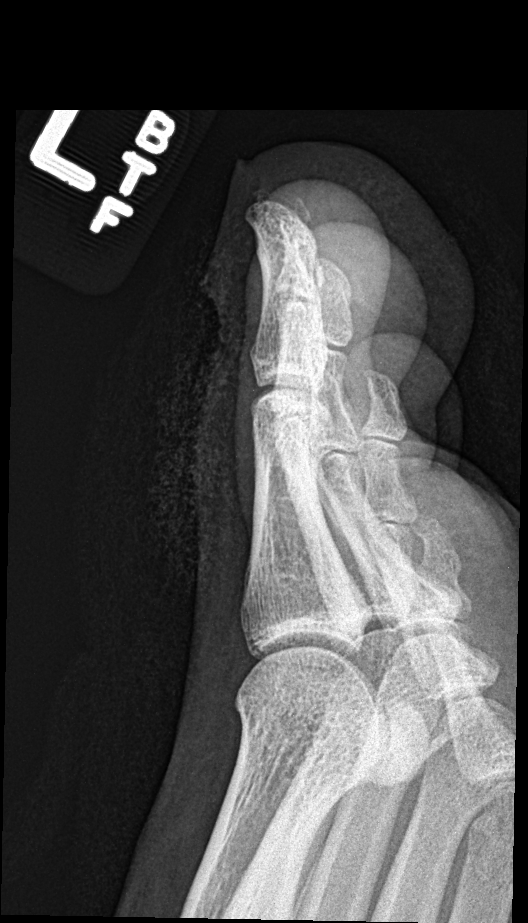

[toe lat (2 of 2)]
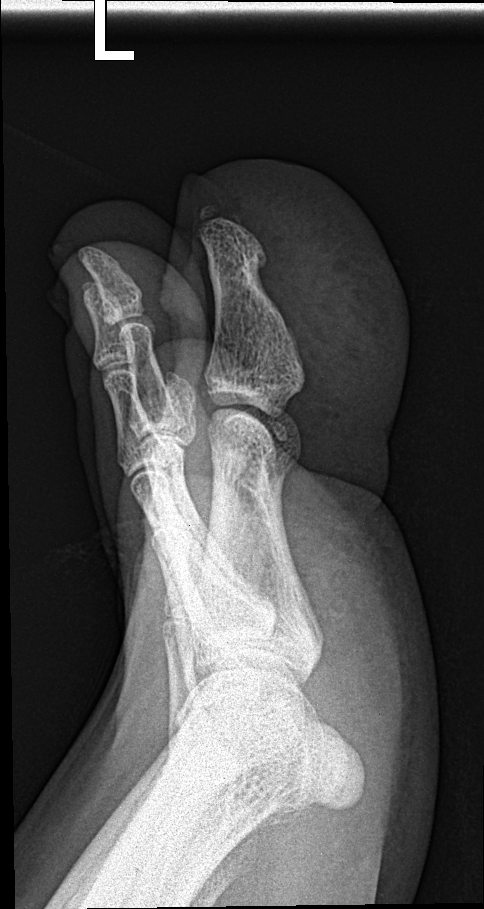

[4 of 4 positions shown; findings below may reference images not displayed]

FINDINGS: Slightly displaced fracture of the central and lateral tuft of the
distal phalanx is seen. No other bony or joint abnormality.
IMPRESSION: Tuft fracture of the great toe as described above.

## 2021-06-20 ENCOUNTER — Other Ambulatory Visit: Payer: Self-pay

## 2021-06-20 ENCOUNTER — Encounter: Payer: BC Managed Care – PPO | Admitting: Family Medicine

## 2021-07-16 ENCOUNTER — Encounter: Payer: Self-pay | Admitting: Family Medicine

## 2021-07-18 ENCOUNTER — Ambulatory Visit (INDEPENDENT_AMBULATORY_CARE_PROVIDER_SITE_OTHER): Payer: Self-pay | Admitting: Family Medicine

## 2021-07-18 ENCOUNTER — Encounter: Payer: Self-pay | Admitting: Family Medicine

## 2021-07-18 ENCOUNTER — Other Ambulatory Visit: Payer: Self-pay

## 2021-07-18 VITALS — BP 128/64 | Temp 97.0°F | Ht 66.0 in | Wt 221.0 lb

## 2021-07-18 DIAGNOSIS — Z13220 Encounter for screening for lipoid disorders: Secondary | ICD-10-CM

## 2021-07-18 DIAGNOSIS — Z0000 Encounter for general adult medical examination without abnormal findings: Secondary | ICD-10-CM

## 2021-07-18 DIAGNOSIS — L409 Psoriasis, unspecified: Secondary | ICD-10-CM

## 2021-07-18 DIAGNOSIS — Z23 Encounter for immunization: Secondary | ICD-10-CM

## 2021-07-18 MED ORDER — CLOBETASOL PROPIONATE 0.05 % EX SOLN: 1.0000 "application " | Freq: Two times a day (BID) | CUTANEOUS | 2 refills | Status: AC

## 2021-07-18 MED ORDER — KETOCONAZOLE 2 % EX CREA: 1.0000 "application " | TOPICAL_CREAM | Freq: Two times a day (BID) | CUTANEOUS | 4 refills | Status: AC

## 2021-07-18 NOTE — Progress Notes (Signed)
   Subjective:    Patient ID: Jeffery Brooks, male    DOB: [birth year], 25 y.o.   MRN: [id number]  HPI The patient comes in today for a wellness visit. Patient was smoking 2 packs a day Now he is vaping He denies any chest pain shortness of breath    A review of their health history was completed.  A review of medications was also completed.  Any needed refills; none  Eating habits: eating better   Falls/  MVA accidents in past few months: no  Regular exercise: started back working out   Specialist pt sees on regular basis: none  Preventative health issues were discussed.   Additional concerns: possible psoriasis   Patient is doing exercise try to lose weight trying to eat healthy  Review of Systems     Objective:   Physical Exam  General-in no acute distress Eyes-no discharge Lungs-respiratory rate normal, CTA CV-no murmurs,RRR Extremities skin warm dry no edema Neuro grossly normal Behavior normal, alert Does have scaling in the scalp on both sides more in the occiput area  He also has a rash in the groin area Is near the scrotum.  Red.    Assessment & Plan:  1. Well adult exam Adult wellness-complete.wellness physical was conducted today. Importance of diet and exercise were discussed in detail.  In addition to this a discussion regarding safety was also covered. We also reviewed over immunizations and gave recommendations regarding current immunization needed for age.  In addition to this additional areas were also touched on including: Preventative health exams needed:  Colonoscopy not indicated Patient encouraged to quit vaping could be difficult for him to do so Patient was advised yearly wellness exam  - Lipid Profile - Basic Metabolic Panel (BMET)  2. Need for vaccination Flu shot today screening lipid - Flu Vaccine QUAD 6+ mos PF IM (Fluarix Quad PF)  3. Screening, lipid Screening lipid - Lipid Profile - Basic Metabolic Panel  (BMET)  4. Scalp psoriasis Clobetasol scalp solution also used T-Gel shampoo 2 times per week for 3 times per week  Groin rash recommend ketoconazole if ongoing trouble let us know we can always set up with dermatology

## 2021-07-18 NOTE — Patient Instructions (Signed)

## 2023-01-20 ENCOUNTER — Ambulatory Visit: Payer: BC Managed Care – PPO | Admitting: Family Medicine
